# Patient Record
Sex: Male | Born: 2000 | Race: White | Hispanic: No | Marital: Single | Smoking: Never smoker
Health system: Southern US, Community
[De-identification: ages and names within clinical notes are randomized; demographics above are authoritative.]

---
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
## 2020-06-15 ENCOUNTER — Encounter (HOSPITAL_BASED_OUTPATIENT_CLINIC_OR_DEPARTMENT_OTHER): Payer: Self-pay

## 2020-06-15 ENCOUNTER — Other Ambulatory Visit: Payer: Self-pay

## 2020-06-15 ENCOUNTER — Emergency Department (HOSPITAL_BASED_OUTPATIENT_CLINIC_OR_DEPARTMENT_OTHER)
Admission: EM | Admit: 2020-06-15 | Discharge: 2020-06-15 | Disposition: A | Discharge status: Home / Self Care | Payer: Commercial Managed Care - PPO | Attending: Emergency Medicine | Admitting: Emergency Medicine

## 2020-06-15 DIAGNOSIS — F419 Anxiety disorder, unspecified: Secondary | ICD-10-CM | POA: Diagnosis not present

## 2020-06-15 DIAGNOSIS — R42 Dizziness and giddiness: Secondary | ICD-10-CM | POA: Diagnosis present

## 2020-06-15 MED ORDER — MECLIZINE HCL 25 MG PO TABS: 25.0000 mg | ORAL_TABLET | Freq: Three times a day (TID) | ORAL | 0 refills | Status: DC | PRN

## 2020-06-15 NOTE — ED Triage Notes (Signed)
Pt arrives with c/o SOB last week states he was Covid tested which was negative, then he got his Covid vaccine. Pt reports continued dizziness since went to the dr and was told he has a ear infection and sinus infection, was started on medication for that but c/o continued dizziness. Pt had PCR test on Tuesday that was negative.

## 2020-06-15 NOTE — ED Provider Notes (Signed)
[facility] EMERGENCY DEPARTMENT Provider Note   CSN: [id number] Arrival date & time: 06/15/20  1040     History Chief Complaint  Patient presents with  . Dizziness    Lance Ellis is a 19 y.o. male with PMHx tension headaches and BPPV who presents to the ED today with complaints of persistent dizziness for the past 2 weeks. Pt reports hx of vertigo - he states he takes meclizine "sometimes." He states when he does take it he takes 1 tablet at nighttime. He reports [facility] and was diagnosed with an ear infection and sinusitis; he was prescribed a Zpack however he states he continues to have dizziness intermittently. Pt states if he stares off into the distance for a prolonged period of time he will feel like he is moving even though he isnt. He denies any blurry vision or double vision. Pt states all of this started after he woke up in the middle of the night feeling like he could not take a deep breath in. He states the next day he went to get COVID tested; it was negative. He then decided to get vaccinated the same day. Pt had another COVID test at [facility] the other day which was negative. He denies feeling off balanced with ambulation. No severe headache, vision changes, nausea, vomiting, speech difficulties, confusion, unilateral weakness or numbness, or any other associated symptoms.   The history is provided by the patient and medical records.       History reviewed. No pertinent past medical history.  Patient Active Problem List   Diagnosis Date Noted  . Episodic tension-type headache, not intractable 02/02/2018  . Benign paroxysmal positional vertigo 01/20/2016  . Tics of organic origin 01/20/2016    History reviewed. No pertinent surgical history.     No family history on file.  Social History   Tobacco Use  . Smoking status: Never Smoker  . Smokeless tobacco: Never Used  Substance Use Topics  . Alcohol use: No  . Drug use: No     Home Medications Prior to Admission medications   Medication Sig Start Date End Date Taking? Authorizing Provider  busPIRone (BUSPAR) 10 MG tablet Take 20 mg by mouth daily.    [provider]  cetirizine (ZYRTEC) 10 MG chewable tablet Chew 10 mg by mouth daily.    [provider]  escitalopram (LEXAPRO) 10 MG tablet Take 10 mg by mouth every morning. 12/16/15   [provider]  meclizine (ANTIVERT) 25 MG tablet Take 1 tablet (25 mg total) by mouth 3 (three) times daily as needed for dizziness. 06/15/20   Tanda Rockers, PA-C  methylphenidate 27 MG PO CR tablet Take 27 mg by mouth every morning. 11/09/17   [provider]  ranitidine (ZANTAC) 75 MG tablet Take 150 mg by mouth daily.     [provider]  tiZANidine (ZANAFLEX) 4 MG tablet Take 1/2 to 1 tablet at bedtime as needed for spasm 10/29/17   [provider]    Allergies    Cefdinir and Other  Review of Systems   Review of Systems  Constitutional: Negative for chills and fever.  Eyes: Negative for visual disturbance.  Gastrointestinal: Negative for nausea and vomiting.  Neurological: Positive for dizziness. Negative for syncope, facial asymmetry, speech difficulty, weakness, light-headedness, numbness and headaches.  All other systems reviewed and are negative.   Physical Exam Updated Vital Signs BP 125/84 (BP Location: Left Arm)  Pulse 87   Temp 98.8 F (37.1 C) (Oral)   Resp (!) 22   Ht 5\' 10"  (1.778 m)   Wt 77.1 kg   SpO2 100%   BMI 24.39 kg/m   Physical Exam Vitals and nursing note reviewed.  Constitutional:      Appearance: He is not ill-appearing or diaphoretic.  HENT:     Head: Normocephalic and atraumatic.     Right Ear: Tympanic membrane, ear canal and external ear normal.     Left Ear: Tympanic membrane, ear canal and external ear normal.  Eyes:     Extraocular Movements: Extraocular movements intact.     Conjunctiva/sclera: Conjunctivae normal.      Pupils: Pupils are equal, round, and reactive to light.  Cardiovascular:     Rate and Rhythm: Normal rate and regular rhythm.     Pulses: Normal pulses.  Pulmonary:     Effort: Pulmonary effort is normal.     Breath sounds: Normal breath sounds. No wheezing, rhonchi or rales.  Abdominal:     Palpations: Abdomen is soft.     Tenderness: There is no abdominal tenderness. There is no guarding or rebound.  Musculoskeletal:     Cervical back: Neck supple.  Skin:    General: Skin is warm and dry.  Neurological:     Mental Status: He is alert.     Comments: CN 3-12 grossly intact A&O x4 GCS 15 Sensation and strength intact Gait nonataxic including with tandem walking Coordination with finger-to-nose WNL Neg romberg, neg pronator drift     ED Results / Procedures / Treatments   Labs (all labs ordered are listed, but only abnormal results are displayed) Labs Reviewed - No data to display  EKG None  Radiology No results found.  Procedures Procedures (including critical care time)  Medications Ordered in ED Medications - No data to display  ED Course  I have reviewed the triage vital signs and the nursing notes.  Pertinent labs & imaging results that were available during my care of the patient were reviewed by me and considered in my medical decision making (see chart for details).    MDM Rules/Calculators/A&P                          19 year old male who presents to the ED today complaining of recurrent dizziness for the past 2 weeks.  He has a history of vertigo and occasionally will take meclizine once daily.  He does not know what dose he is on.  Reports that he was recently treated for an ear infection and sinusitis with zpack.  On arrival to the ED patient is afebrile, nontachycardic nontachypneic.  He appears to be in no acute distress.  Per chart review patient has had multiple urgent care, family medicine visits since 2019 for ear infections and chronic sinusitis.  On exam today pt's TMs are clear; no sinus TTP; normal EACs bilaterally. LCTAB; pt did just get COVID tested and have a CXR at [facility] 5 days ago which was negative. He has no focal neuro deficits on exam today. I personally ambulated patient; steady gait. It does appear he saw peds neuro in the past for his vertigo and tension headaches. Will recommend he follow back up with them however feel pt may benefit from ENT referral at this time. Have instructed that pt increase his meclizine dose to 3 times per day PRN; will represcribe for him today.  As I walk  back into the room to update patient on the plan his mom is in the room with him. She states that pt has been off of his anxiety medication for the past year and she thinks that this is causing him to be hyper focused on his symptoms. I have recommended that follow back up with PCP to discuss restarting medications if pt is interested. They report that pt is scheduled to see Dr. Jenne Pane on 09/24 however if they can be seen sooner they are interested. Will place ambulatory referral at this time. Mom and pt to pick up meclizine and take TID PRN. Pt stable for discharge.   This note was prepared using Dragon voice recognition software and may include unintentional dictation errors due to the inherent limitations of voice recognition software.   Final Clinical Impression(s) / ED Diagnoses Final diagnoses:  Vertigo  Anxiety    Rx / DC Orders ED Discharge Orders         Ordered    meclizine (ANTIVERT) 25 MG tablet  3 times daily PRN        06/15/20 1715           Discharge Instructions     Please pick up Meclizine and take as prescribed (you can take it up to 3 times per day).  Call Dr. Annalee Genta E[facility] to see if they can get you in sooner than your scheduled appointment in the next few weeks.  Drink plenty of water to stay hydrated and get plenty of sleep.  Follow up with your PCP to discuss restarting anxiety medications.        Tanda Rockers, PA-C 06/15/20 1717    Tegeler, Canary Brim, MD 06/15/20 [phone]

## 2020-06-15 NOTE — Discharge Instructions (Addendum)
Please pick up Meclizine and take as prescribed (you can take it up to 3 times per day).  Call Dr. Annalee Genta ENT to see if they can get you in sooner than your scheduled appointment in the next few weeks.  Drink plenty of water to stay hydrated and get plenty of sleep.  Follow up with your PCP to discuss restarting anxiety medications.

## 2020-06-18 ENCOUNTER — Other Ambulatory Visit: Payer: Self-pay

## 2020-06-18 ENCOUNTER — Ambulatory Visit (INDEPENDENT_AMBULATORY_CARE_PROVIDER_SITE_OTHER): Payer: Commercial Managed Care - PPO | Admitting: Pediatrics

## 2020-06-18 ENCOUNTER — Encounter (INDEPENDENT_AMBULATORY_CARE_PROVIDER_SITE_OTHER): Payer: Self-pay | Admitting: Pediatrics

## 2020-06-18 VITALS — BP 120/80 | HR 72 | Ht 69.75 in | Wt 171.0 lb

## 2020-06-18 DIAGNOSIS — R42 Dizziness and giddiness: Secondary | ICD-10-CM | POA: Insufficient documentation

## 2020-06-18 DIAGNOSIS — F411 Generalized anxiety disorder: Secondary | ICD-10-CM | POA: Diagnosis not present

## 2020-06-18 DIAGNOSIS — G44219 Episodic tension-type headache, not intractable: Secondary | ICD-10-CM | POA: Diagnosis not present

## 2020-06-18 MED ORDER — BUSPIRONE HCL 10 MG PO TABS
ORAL_TABLET | ORAL | 5 refills | Status: DC
Start: 2020-06-18 — End: 2020-07-10

## 2020-06-18 NOTE — Patient Instructions (Signed)
Was a pleasure to see you today.  Your examination was entirely normal.  I know that you have a lot of symptoms, but fortunately they are not evident to me today.  I think that the headaches you have experienced are tension type in nature.  I do not think that you have benign positional paroxysmal vertigo.  I do think that there is some issue with disequilibrium, but I am not certain what is causing it.  I think it is reasonable to take 400 mg of ibuprofen when you have a headache.  I do not think you need neuroimaging at this time.  Due to your self-admitted anxiety we will restart BuSpar at a low dose.  I will be prepared to increase the dose every couple of weeks.  Simultaneously I want your mother to look and find a list of psychiatrists in our community who are in network for your insurance we will make a decision about who to request a consult from.  Please come back to see me in 3 months I will see you sooner based on your clinical need.

## 2020-06-18 NOTE — Progress Notes (Signed)
Patient: Lance Ellis MRN: [id number] Sex: male DOB: 05-May-2001  Provider: Ellison Carwin, MD Location of Care: [facility]  Note type: Routine return visit  History of Present Illness: Referral Source: Loyola Mast, MD History from: mother, patient and [facility] chart Chief Complaint: Headaches  Lance Ellis is a 19 y.o. male who was evaluated June 18, 2020 for the first time since February 02, 2018.  At that time he had an unusual constellation of daily headaches and positional dizziness.  He said that the dizziness was also present daily and felt as if he was on a rocking boat however he had a positional quality to it which suggested to me that he might have benign paroxysmal positional vertigo of left ear.  He seemed to be able to minimize his symptoms.  I do not think that he ever was seen by an ear nose and throat physician to perform an Epley maneuver that might have brought his symptoms under complete control.  2 weeks ago he had onset of awakening around 3 AM feeling dizzy and having difficulty breathing.  His chest felt tight.  He felt a thumping in the back of his head.  When he walks he feels as if he is floating.  He is not experiencing true vertigo although if he twists his head to the left with the left ear facing down, it exacerbates his dizziness.  His headaches are nondescript dull achy pain.  There are no other symptoms in association with them.  He has had a number of diagnoses of sinusitis and otitis media but is yet to see ear nose and throat physician which I think is needed.  He had complaints of stomach pain and was diagnosed with gastroesophageal reflux.  He was treated with omeprazole which did not change his stomach pain.  There are times that he would experience rather significant dizziness while just sitting on the floor on one occasion he nearly fell backwards.  He felt as if he was on a ship.  A week ago he was diagnosed with otitis media and  sinusitis and placed on erythromycin which further upset his stomach.  He has had at least 2 test for Covid and is had 1 vaccination with the Pfizer vaccine.  He was seen in the emergency department last weekend and had some high-pitched ringing in his ears but his examination was entirely normal according to the emergency physician.  The symptoms described above largely subsided other than the dull headache and the feeling of unsteady of lightheadedness.  His headaches seem to be most prominent in the vertex in the parietal region and are intermittent.  He was scheduled to see Dr. Jenne Pane or Dr. Annalee Genta, ENT surgeons to evaluate his ears and sinuses.  He graduated from high school and attends [employer] in a general education course.  He is not working now.  He readily admits to having significant problems with anxiety.  His psychiatrist retired.  We will need to find him another psychiatrist but until then I am willing to start him on low-dose BuSpar.  Review of Systems: A complete review of systems was remarkable for patient is here to be seen for dizziness. He reports that two weeks ago, he experienced dizziness and trouble breathing at 3 am. He states that he went to the er and was given medication for GERD. He states at times, when he stands still that he feels thumping in the back of his head. He also states that  he has randompain in his head and eyes. He reports that when he walks, he feels like he is floating. He has no other concerns at this time. , all other systems reviewed and negative.  Past Medical History History reviewed. No pertinent past medical history. Hospitalizations: No., Head Injury: No., Nervous System Infections: No., Immunizations up to date: Yes.    Behavior History Anxiety  Surgical History History reviewed. No pertinent surgical history.  Family History family history is not on file. Family history is negative for migraines, seizures, intellectual disabilities,  blindness, deafness, birth defects, chromosomal disorder, or autism.  Social History Socioeconomic History  . Marital status: Single  . Years of education:  53  . Highest education level:  High school graduate, first year of community college  Occupational History  . Not currently working  Tobacco Use  . Smoking status: Never Smoker  . Smokeless tobacco: Never Used  Substance and Sexual Activity  . Alcohol use: No  . Drug use: No  . 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     He attended [employer].     He enjoys playing basketball, football, swimming, and playing video games.    He lives with his mother and younger brother. He and his brother visit his father every other weekend and one night a week.     He is currently attending [employer] for general education   Allergies Allergen Reactions  . Cefdinir Rash  . Other     Seasonal Allergies     Physical Exam BP 120/80   Pulse 72   Ht 5' 9.75" (1.772 m)   Wt 171 lb (77.6 kg)   BMI 24.71 kg/m   General: alert, well developed, well nourished, in no acute distress, sandy hair, blue eyes, right handed Head: normocephalic, no dysmorphic features Ears, Nose and Throat: Otoscopic: tympanic membranes normal; pharynx: oropharynx is pink without exudates or tonsillar hypertrophy Neck: supple, full range of motion, no cranial or cervical bruits Respiratory: auscultation clear Cardiovascular: no murmurs, pulses are normal Musculoskeletal: no skeletal deformities or apparent scoliosis Skin: no rashes or neurocutaneous lesions  Neurologic Exam  Mental Status: alert; oriented to person, place and year; knowledge is normal for age; language is normal Cranial Nerves: visual fields are full to double simultaneous stimuli; extraocular movements are full and conjugate; pupils are round reactive to light; funduscopic examination shows sharp disc margins with normal vessels;  symmetric facial strength; midline tongue and uvula; air conduction is greater than bone conduction bilaterally; negative Dix-Hallpike bilaterally Motor: normal strength, tone and mass; good fine motor movements; no pronator drift Sensory: intact responses to cold, vibration, proprioception and stereognosis Coordination: good finger-to-nose, rapid repetitive alternating movements and finger apposition Gait and Station: normal gait and station: patient is able to walk on heels, toes and tandem without difficulty; balance is adequate; Romberg exam is negative; Gower response is negative Reflexes: symmetric and diminished bilaterally; no clonus; bilateral flexor plantar responses  Assessment 1.  Episodic tension-type headache, not intractable, G44.219. 2.  Disequilibrium, R42. 3.  Generalized anxiety disorder, F41.1.  Discussion I am not concerned about his headaches.  The do not appear to be migrainous and there is no reason to suggest that there is any underlying brain abnormality responsible for causing them.  His disequilibrium is not as severe as it was a couple of years ago.  He does not show signs of positional vertigo in a Dix-Hallpike maneuver.  I think that anxiety is driving a lot of his symptoms.  He needs to see a psychiatrist.  I asked his mother to provide a list of providers that are in network.  Plan For now we will start him on low-dose BuSpar.  I told mother that I do not intend to prescribe this long-term and that we have to get him a psychiatrist who can assess him and properly treat him possibly with higher doses of BuSpar and Lexapro.  He will return to see me in 3 months time to check on his progress I will see him sooner based on clinical need.  Greater than 50% of the 30-minute visit was spent in counseling and coordination of care concerning the emergence of headaches disequilibrium and longstanding problems that he has with anxiety.   Medication List   Accurate as of  June 18, 2020 11:59 PM. If you have any questions, ask your nurse or doctor.      TAKE these medications   busPIRone 10 MG tablet Commonly known as: BUSPAR Take 1 tablet daily What changed:   how much to take  how to take this  when to take this  additional instructions Changed by: Ellison Carwin, MD   cetirizine 10 MG chewable tablet Commonly known as: ZYRTEC Chew 10 mg by mouth daily.   tiZANidine 4 MG tablet Commonly known as: ZANAFLEX Take 1/2 to 1 tablet at bedtime as needed for spasm    The medication list was reviewed and reconciled. All changes or newly prescribed medications were explained.  A complete medication list was provided to the patient/caregiver.  Deetta Perla MD

## 2020-06-28 ENCOUNTER — Other Ambulatory Visit: Payer: Self-pay | Admitting: Physician Assistant

## 2020-06-28 DIAGNOSIS — R1011 Right upper quadrant pain: Secondary | ICD-10-CM

## 2020-07-02 ENCOUNTER — Other Ambulatory Visit: Payer: Self-pay | Admitting: Otolaryngology

## 2020-07-02 ENCOUNTER — Other Ambulatory Visit: Payer: Self-pay

## 2020-07-02 ENCOUNTER — Encounter (INDEPENDENT_AMBULATORY_CARE_PROVIDER_SITE_OTHER): Payer: Self-pay | Admitting: Pediatrics

## 2020-07-02 ENCOUNTER — Ambulatory Visit (INDEPENDENT_AMBULATORY_CARE_PROVIDER_SITE_OTHER): Payer: Commercial Managed Care - PPO | Admitting: Pediatrics

## 2020-07-02 VITALS — BP 112/80 | HR 60 | Ht 69.5 in | Wt 172.8 lb

## 2020-07-02 DIAGNOSIS — R42 Dizziness and giddiness: Secondary | ICD-10-CM | POA: Diagnosis not present

## 2020-07-02 DIAGNOSIS — G44229 Chronic tension-type headache, not intractable: Secondary | ICD-10-CM | POA: Diagnosis not present

## 2020-07-02 DIAGNOSIS — G44219 Episodic tension-type headache, not intractable: Secondary | ICD-10-CM | POA: Diagnosis not present

## 2020-07-02 DIAGNOSIS — F411 Generalized anxiety disorder: Secondary | ICD-10-CM

## 2020-07-02 DIAGNOSIS — J329 Chronic sinusitis, unspecified: Secondary | ICD-10-CM

## 2020-07-02 NOTE — Patient Instructions (Addendum)
I ordered the study.  I hope that it will be accepted.  Let me know when the CT of sinuses is complete.  There is an abnormal study have them make a copy and give it to me.  Keep your previously scheduled appointment.

## 2020-07-02 NOTE — Progress Notes (Signed)
Patient: Lance Ellis MRN: [id number] Sex: male DOB: 08-15-2001  Provider: Ellison Carwin, MD Location of Care: [facility]  Note type: Routine return visit  History of Present Illness: Referral Source: Loyola Mast, MD History from: mother, patient and [facility] chart Chief Complaint: Headaches  Lance Ellis is a 19 y.o. male who was seen July 02, 2020 for the first time since June 18, 2020.  He has a history of daily headaches associated with dizziness.  This dizziness is had different qualities but is more consistent with disequilibrium like rocking on a boat there was a positional quality to it at one point which is not present now.  2 weeks ago he awakened feeling dizzy having difficulty breathing his chest was tight he felt a thumping in the back of his head and felt as if he was floating while walking.  This is most consistent with a panic attack.  Headaches are nondescript dull achy pain.  He does not experience nausea vomiting sensitivity to light or sound  He has complaints of chronic stomach pain which did not respond to omeprazole despite a diagnosis of gastroesophageal reflux.  He has been recently diagnosed with otitis media and sinusitis was treated with erythromycin which upset his stomach.  He was seen by Dr. Jenne Pane who agreed that he should have a CT scan of the sinuses but thought this was more likely a neurologic problem.  He has significant problems with anxiety and has been placed on BuSpar and more recently escitalopram.  He returns today because he continues to have headaches and dizziness, pain behind his eyes tinnitus.  He is frightened of these symptoms and is requesting an MRI scan of the brain.  He has no other signs of illness.  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  Review of Systems: A complete review of systems was remarkable for patient is here to be seen for headaches. Patient  reports that he is still experiencing pain in his eyes and he back of his head. He also reports that he sometimes has ringing in his ears. He states that he is worried about the headaches and dizziness as well. Mom reports that she would like to have a MRI done. No other concerns reported at this time., all other systems reviewed and negative.  Past Medical History History reviewed. No pertinent past medical history. Hospitalizations: No., Head Injury: No., Nervous System Infections: No., Immunizations up to date: Yes.     Birth History 9 lbs. 7 oz. infant born at 49.[redacted] weeks gestational age to a 19 year old g 1 p 0 male. Gestation was complicated by uterine fibroid Mother received Epidural anesthesia  Primary cesarean section Nursery Course was uncomplicated Growth and Development was recalled as  normal  Behavior History Anxiety  Surgical History History reviewed. No pertinent surgical history.  Family History family history is not on file. Family history is negative for migraines, seizures, intellectual disabilities, blindness, deafness, birth defects, chromosomal disorder, or autism.  Social History Socioeconomic History  . Marital status: Single  . Years of education:  66  . Highest education level:  High school graduate  Occupational History  . Not employed  Tobacco Use  . Smoking status: Never Smoker  . Smokeless tobacco: Never Used  Substance and Sexual Activity  . Alcohol use: No  . Drug use: No  . 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     He attended [employer].     He enjoys playing basketball, football, swimming, and playing video games.    He lives with his mother and younger brother. He and his brother visit his father every other weekend and one night a week.     He is currently attending [employer] for general education   Allergies Allergen Reactions  . Cefdinir Rash  . Other      Seasonal Allergies     Physical Exam BP 112/80   Pulse 60   Ht 5' 9.5" (1.765 m)   Wt 172 lb 12.8 oz (78.4 kg)   BMI 25.15 kg/m   General: alert, well developed, well nourished, in no acute distress, sandy hair, blue eyes, right handed Head: normocephalic, no dysmorphic features Ears, Nose and Throat: Otoscopic: tympanic membranes normal; pharynx: oropharynx is pink without exudates or tonsillar hypertrophy Neck: supple, full range of motion, no cranial or cervical bruits Respiratory: auscultation clear Cardiovascular: no murmurs, pulses are normal Musculoskeletal: no skeletal deformities or apparent scoliosis Skin: no rashes or neurocutaneous lesions  Neurologic Exam  Mental Status: alert; oriented to person, place and year; knowledge is normal for age; language is normal Cranial Nerves: visual fields are full to double simultaneous stimuli; extraocular movements are full and conjugate; pupils are round reactive to light; funduscopic examination shows sharp disc margins with normal vessels; symmetric facial strength; midline tongue and uvula; air conduction is greater than bone conduction bilaterally Motor: Normal strength, tone and mass; good fine motor movements; no pronator drift Sensory: intact responses to cold, vibration, proprioception and stereognosis Coordination: good finger-to-nose, rapid repetitive alternating movements and finger apposition Gait and Station: normal gait and station: patient is able to walk on heels, toes and tandem without difficulty; balance is adequate; Romberg exam is negative; Gower response is negative Reflexes: symmetric and diminished bilaterally; no clonus; bilateral flexor plantar responses  Assessment 1.  Episodic tension-type headache, not intractable, G44.219. 2.  Disequilibrium, R42. 3.  Generalized anxiety disorder, F41.1.  Discussion I see no significant change in his examination.  I explained to mother the process by which MRI scans  are approved.  I told her that at her request I would submit the request for an MRI scan of the brain without and with contrast.  I told her that I would let her know if it was approved or denied.  Plan I recommend that he continue taking medications as prescribed.  If we can successfully deal with his anxiety, I think his headaches and other symptoms may improve.  He will return to see me in December 2021 as previously planned.  I asked his mother to make certain that I got a copy of the sinus films particularly if they were positive.   Medication List   Accurate as of July 02, 2020  3:46 PM. If you have any questions, ask your nurse or doctor.      TAKE these medications   busPIRone 10 MG tablet Commonly known as: BUSPAR Take 1 tablet daily   cetirizine 10 MG chewable tablet Commonly known as: ZYRTEC Chew 10 mg by mouth daily.   escitalopram 10 MG tablet Commonly known as: LEXAPRO Take by mouth.   fluticasone 50 MCG/ACT nasal spray Commonly known as: FLONASE 1 spray by Each Nare route daily for 30 days.   meclizine 25 MG tablet Commonly known as: ANTIVERT Take 25 mg by mouth 3 (three) times daily as needed.  methylphenidate 18 MG CR tablet Commonly known as: CONCERTA Take by mouth.   montelukast 10 MG tablet Commonly known as: SINGULAIR Take by mouth.    The medication list was reviewed and reconciled. All changes or newly prescribed medications were explained.  A complete medication list was provided to the patient/caregiver.  Deetta Perla MD

## 2020-07-05 ENCOUNTER — Other Ambulatory Visit: Payer: Commercial Managed Care - PPO

## 2020-07-10 ENCOUNTER — Other Ambulatory Visit (INDEPENDENT_AMBULATORY_CARE_PROVIDER_SITE_OTHER): Payer: Self-pay | Admitting: Pediatrics

## 2020-07-11 ENCOUNTER — Other Ambulatory Visit: Payer: Commercial Managed Care - PPO

## 2020-07-18 ENCOUNTER — Ambulatory Visit
Admission: RE | Admit: 2020-07-18 | Discharge: 2020-07-18 | Disposition: A | Discharge status: Home / Self Care | Payer: Commercial Managed Care - PPO | Source: Ambulatory Visit | Attending: Physician Assistant | Admitting: Physician Assistant

## 2020-07-18 DIAGNOSIS — R1011 Right upper quadrant pain: Secondary | ICD-10-CM

## 2020-07-23 ENCOUNTER — Other Ambulatory Visit: Payer: Self-pay

## 2020-07-23 ENCOUNTER — Ambulatory Visit
Admission: RE | Admit: 2020-07-23 | Discharge: 2020-07-23 | Disposition: A | Discharge status: Home / Self Care | Payer: Commercial Managed Care - PPO | Source: Ambulatory Visit | Attending: Pediatrics | Admitting: Pediatrics

## 2020-07-23 DIAGNOSIS — G44219 Episodic tension-type headache, not intractable: Secondary | ICD-10-CM

## 2020-07-23 DIAGNOSIS — G44229 Chronic tension-type headache, not intractable: Secondary | ICD-10-CM

## 2020-07-23 DIAGNOSIS — R42 Dizziness and giddiness: Secondary | ICD-10-CM

## 2020-07-23 MED ORDER — GADOBENATE DIMEGLUMINE 529 MG/ML IV SOLN
15.0000 mL | Freq: Once | INTRAVENOUS | Status: AC | PRN
  Administered 2020-07-23: 15 mL via INTRAVENOUS

## 2020-07-24 ENCOUNTER — Telehealth (INDEPENDENT_AMBULATORY_CARE_PROVIDER_SITE_OTHER): Payer: Self-pay | Admitting: Pediatrics

## 2020-07-24 NOTE — Telephone Encounter (Signed)
I called mom to let her know that the study was normal.  I asked her to pass that on to [facility].  I also suggested that he sign up for My Chart.  According to her, his dizziness has been somewhat better since he started the Lexapro.  It may very well be we have a functional issue here.  I asked her to pass this information on to Dr. Jenne Pane.  I believe that there is a maxillofacial study that will take place but I expect to be normal based on what we have seen thus far.

## 2020-07-29 ENCOUNTER — Other Ambulatory Visit: Payer: Commercial Managed Care - PPO

## 2020-09-26 ENCOUNTER — Ambulatory Visit (INDEPENDENT_AMBULATORY_CARE_PROVIDER_SITE_OTHER): Payer: Commercial Managed Care - PPO | Admitting: Pediatrics

## 2020-10-29 ENCOUNTER — Ambulatory Visit (INDEPENDENT_AMBULATORY_CARE_PROVIDER_SITE_OTHER): Payer: Commercial Managed Care - PPO | Admitting: Pediatrics

## 2020-10-30 ENCOUNTER — Other Ambulatory Visit (INDEPENDENT_AMBULATORY_CARE_PROVIDER_SITE_OTHER): Payer: Self-pay | Admitting: Pediatrics

## 2020-12-05 ENCOUNTER — Other Ambulatory Visit (INDEPENDENT_AMBULATORY_CARE_PROVIDER_SITE_OTHER): Payer: Self-pay | Admitting: Family

## 2020-12-29 ENCOUNTER — Other Ambulatory Visit (INDEPENDENT_AMBULATORY_CARE_PROVIDER_SITE_OTHER): Payer: Self-pay | Admitting: Family

## 2021-01-17 ENCOUNTER — Other Ambulatory Visit (INDEPENDENT_AMBULATORY_CARE_PROVIDER_SITE_OTHER): Payer: Self-pay | Admitting: Family

## 2021-01-17 DIAGNOSIS — F411 Generalized anxiety disorder: Secondary | ICD-10-CM

## 2021-01-20 NOTE — Telephone Encounter (Signed)
Request for 90 days.

## 2021-02-13 ENCOUNTER — Encounter (INDEPENDENT_AMBULATORY_CARE_PROVIDER_SITE_OTHER): Payer: Self-pay

## 2021-08-24 IMAGING — US US ABDOMEN LIMITED
1 series · 14 of 25 positions shown · non-contrast
Comparison: None.

CLINICAL DATA: Right upper quadrant pain for 6 weeks.

EXAM:
ULTRASOUND ABDOMEN LIMITED RIGHT UPPER QUADRANT

[Series 1: us abdomen limited · 0.26mm/px · 14 of 39 slices shown]
[im 1/39]
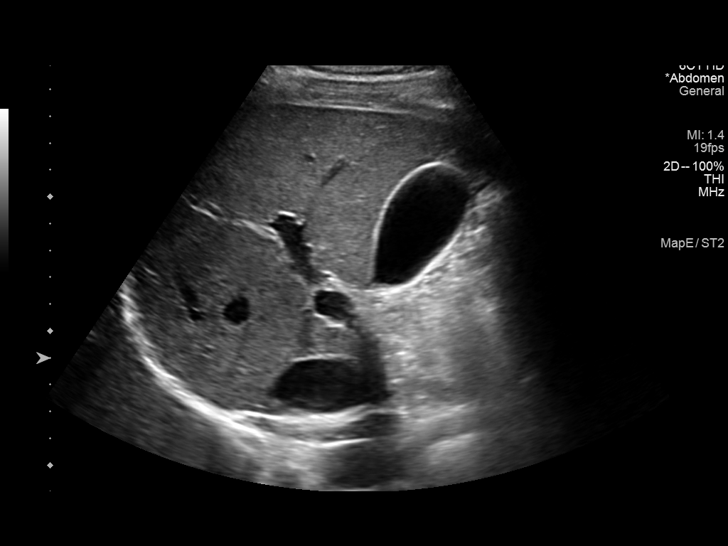
[im 4/39]
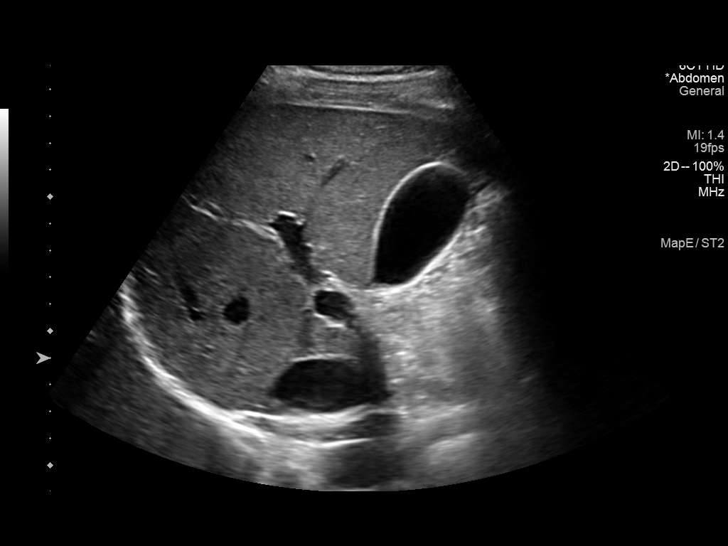
[im 7/39]
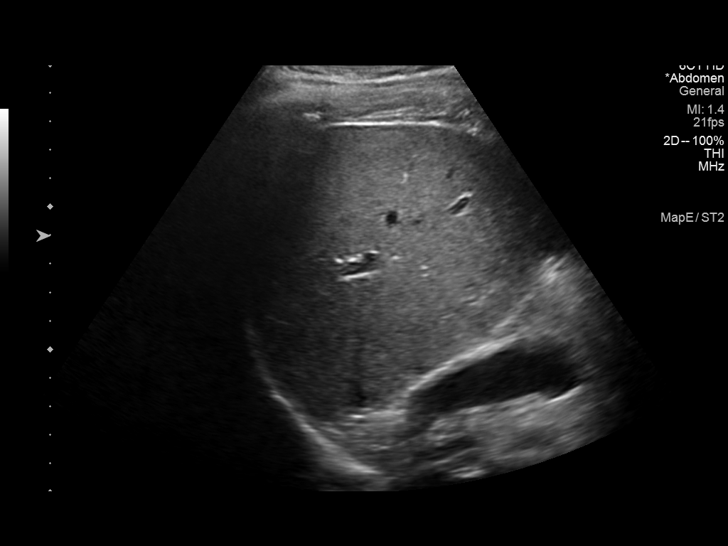
[im 10/39]
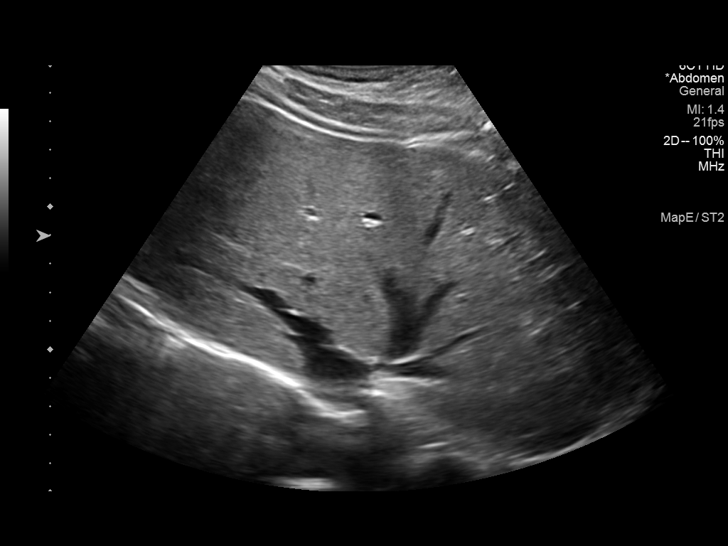
[im 13/39]
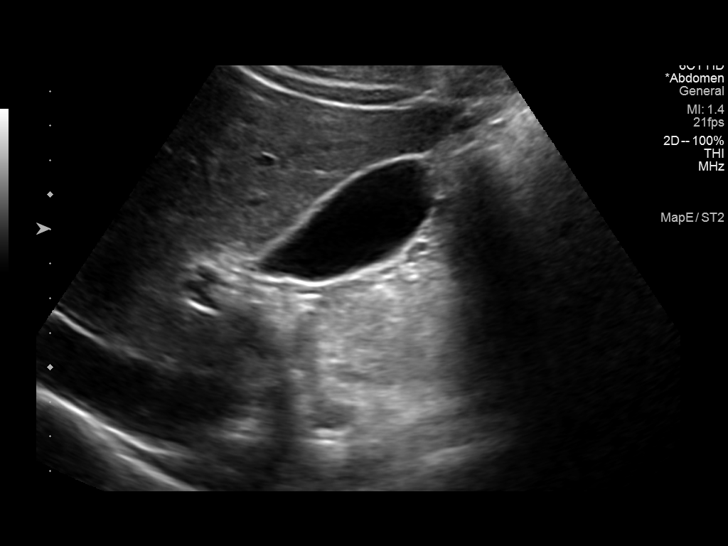
[im 15/39]
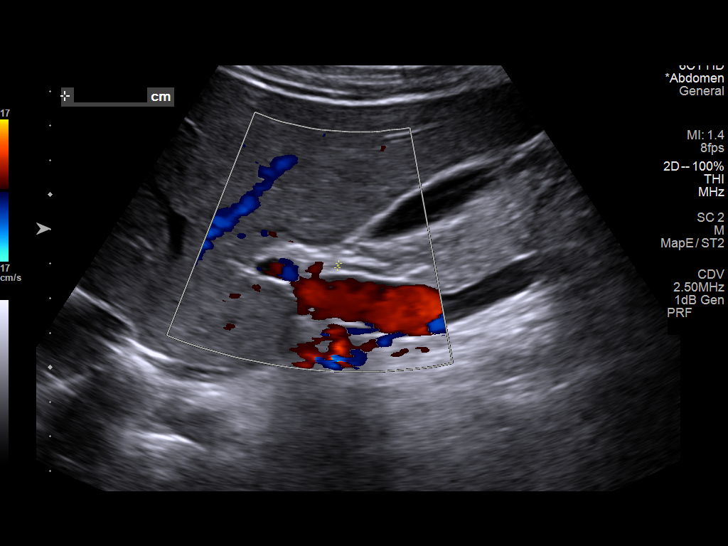
[im 18/39]
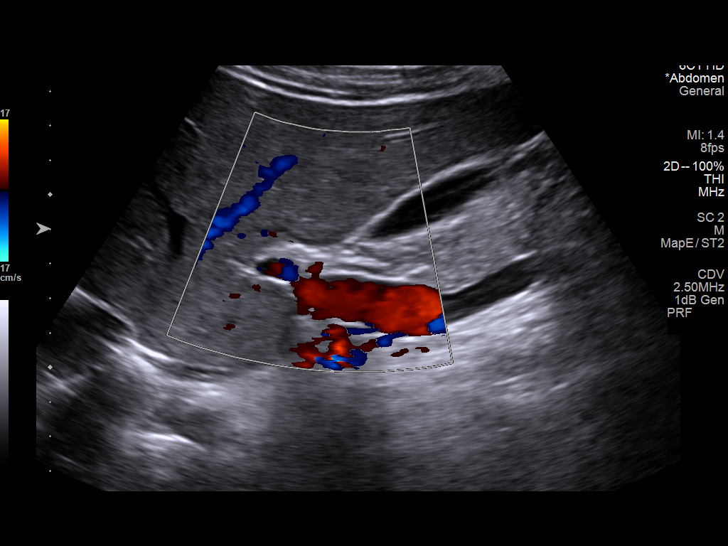
[im 21/39]
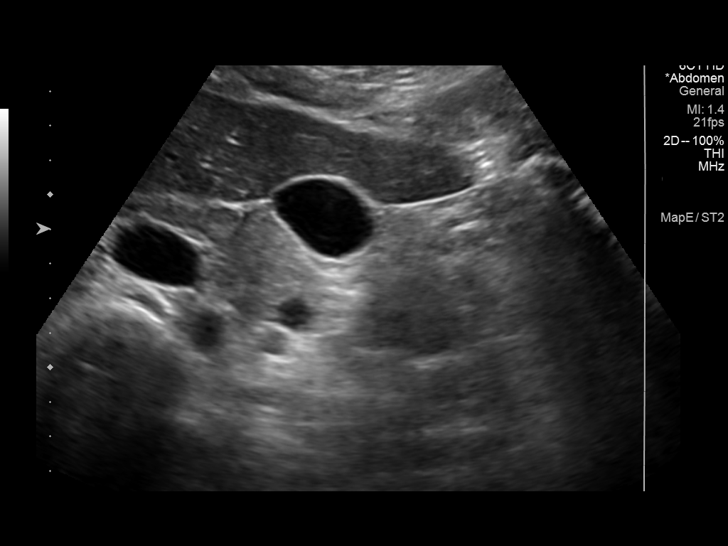
[im 24/39]
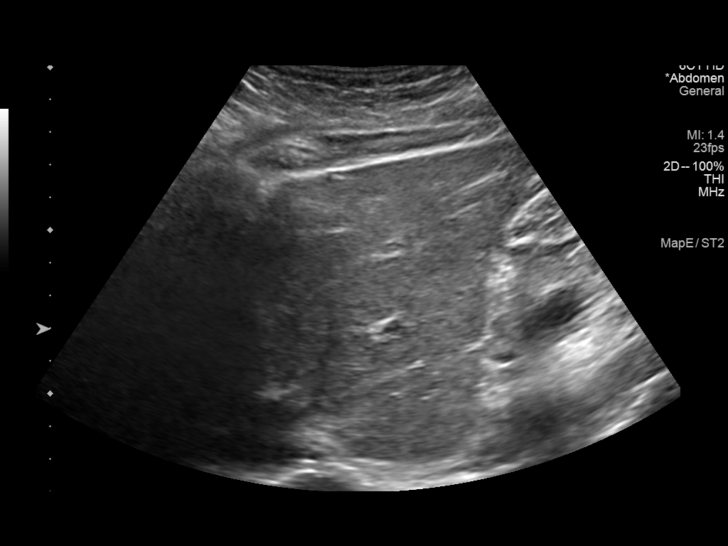
[im 26/39]
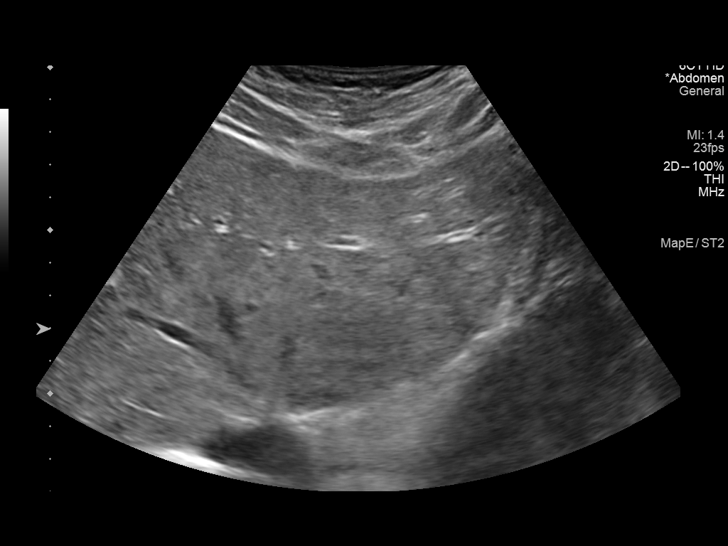
[im 29/39]
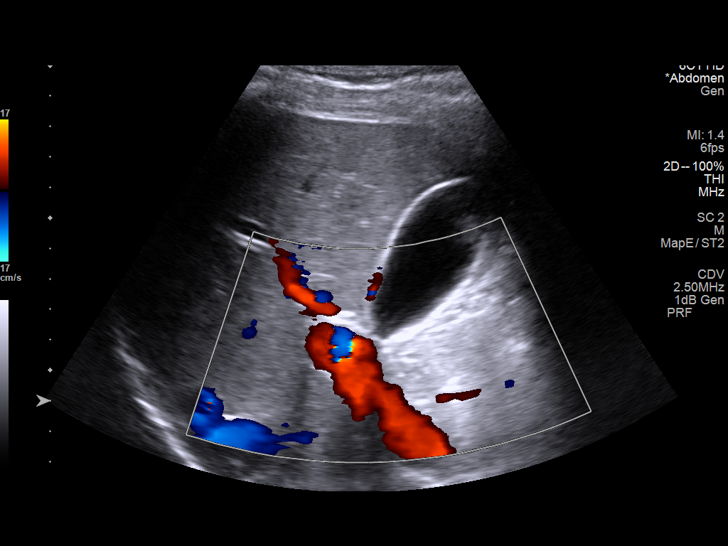
[im 32/39]
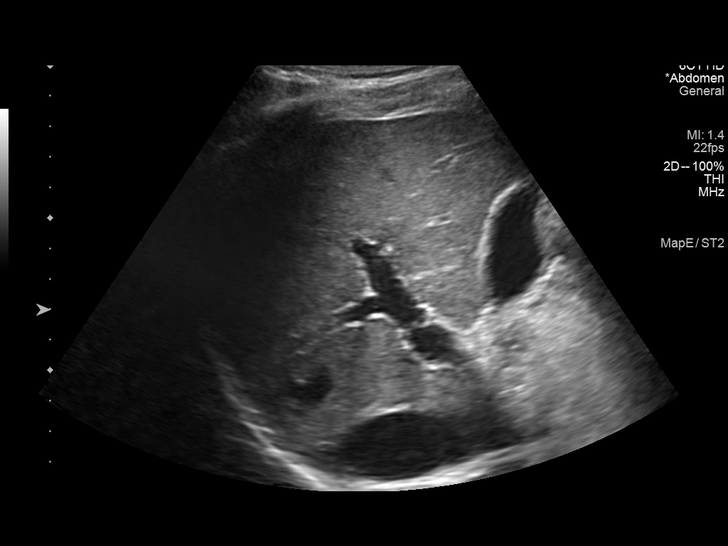
[im 35/39]
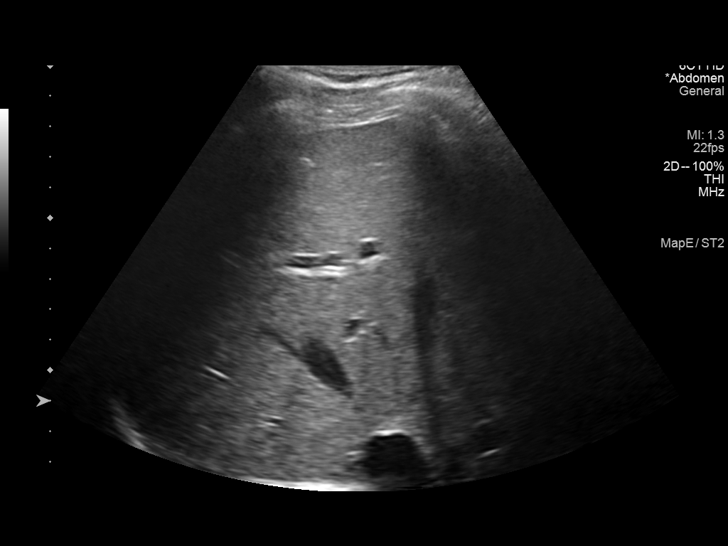
[im 39/39]
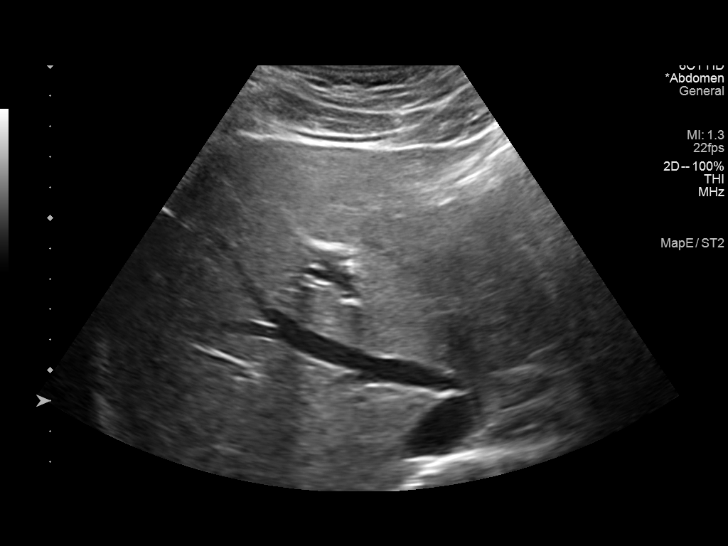

[14 of 25 positions shown; findings below may reference images not displayed]

FINDINGS: Gallbladder:

No gallstones or wall thickening visualized. No sonographic Murphy
sign noted by sonographer.

Common bile duct:

Diameter: 0.1 cm, within normal limits.

Liver:

No focal lesion identified. Within normal limits in parenchymal
echogenicity. Portal vein is patent on color Doppler imaging with
normal direction of blood flow towards the liver.

Other: None.
IMPRESSION: No sonographic finding to explain the patient's abdominal pain.

## 2022-03-20 ENCOUNTER — Emergency Department (HOSPITAL_BASED_OUTPATIENT_CLINIC_OR_DEPARTMENT_OTHER): Payer: BLUE CROSS/BLUE SHIELD

## 2022-03-20 ENCOUNTER — Emergency Department (HOSPITAL_BASED_OUTPATIENT_CLINIC_OR_DEPARTMENT_OTHER)
Admission: EM | Admit: 2022-03-20 | Discharge: 2022-03-20 | Disposition: A | Discharge status: Home / Self Care | Payer: BLUE CROSS/BLUE SHIELD | Attending: Emergency Medicine | Admitting: Emergency Medicine

## 2022-03-20 ENCOUNTER — Other Ambulatory Visit: Payer: Self-pay

## 2022-03-20 ENCOUNTER — Encounter (HOSPITAL_BASED_OUTPATIENT_CLINIC_OR_DEPARTMENT_OTHER): Payer: Self-pay | Admitting: *Deleted

## 2022-03-20 DIAGNOSIS — K529 Noninfective gastroenteritis and colitis, unspecified: Secondary | ICD-10-CM | POA: Diagnosis not present

## 2022-03-20 DIAGNOSIS — R109 Unspecified abdominal pain: Secondary | ICD-10-CM | POA: Diagnosis present

## 2022-03-20 LAB — URINALYSIS, ROUTINE W REFLEX MICROSCOPIC
Bilirubin Urine: NEGATIVE
Glucose, UA: NEGATIVE mg/dL
Hgb urine dipstick: NEGATIVE
Ketones, ur: NEGATIVE mg/dL
Leukocytes,Ua: NEGATIVE
Nitrite: NEGATIVE
Protein, ur: NEGATIVE mg/dL
Specific Gravity, Urine: 1.016 (ref 1.005–1.030)
pH: 6 (ref 5.0–8.0)

## 2022-03-20 LAB — COMPREHENSIVE METABOLIC PANEL
ALT: 45 U/L — ABNORMAL HIGH (ref 0–44)
AST: 29 U/L (ref 15–41)
Albumin: 4.5 g/dL (ref 3.5–5.0)
Alkaline Phosphatase: 64 U/L (ref 38–126)
Anion gap: 11 (ref 5–15)
BUN: 12 mg/dL (ref 6–20)
CO2: 29 mmol/L (ref 22–32)
Calcium: 9.7 mg/dL (ref 8.9–10.3)
Chloride: 101 mmol/L (ref 98–111)
Creatinine, Ser: 1.03 mg/dL (ref 0.61–1.24)
GFR, Estimated: >60 mL/min (ref >60)
Glucose, Bld: 80 mg/dL (ref 70–99)
Potassium: 3.5 mmol/L (ref 3.5–5.1)
Sodium: 141 mmol/L (ref 135–145)
Total Bilirubin: 0.5 mg/dL (ref 0.3–1.2)
Total Protein: 7.7 g/dL (ref 6.5–8.1)

## 2022-03-20 LAB — CBC
HCT: 45.8 % (ref 39.0–52.0)
Hemoglobin: 15.9 g/dL (ref 13.0–17.0)
MCH: 31.6 pg (ref 26.0–34.0)
MCHC: 34.7 g/dL (ref 30.0–36.0)
MCV: 91.1 fL (ref 80.0–100.0)
Platelets: 233 10*3/uL (ref 150–400)
RBC: 5.03 MIL/uL (ref 4.22–5.81)
RDW: 11.4 % — ABNORMAL LOW (ref 11.5–15.5)
WBC: 6.4 10*3/uL (ref 4.0–10.5)
nRBC: 0 % (ref 0.0–0.2)

## 2022-03-20 LAB — LIPASE, BLOOD: Lipase: 18 U/L (ref 11–51)

## 2022-03-20 MED ORDER — ONDANSETRON HCL 4 MG PO TABS: 4.0000 mg | ORAL_TABLET | Freq: Four times a day (QID) | ORAL | 0 refills | Status: AC

## 2022-03-20 NOTE — Discharge Instructions (Addendum)
Your symptoms are most consistent with a viral gastroenteritis, otherwise known as a stomach bug.  You should start feeling better in a few days, however I sent you in some Zofran which is a medication you can take whenever you feel nauseous.  Your ultrasound was normal and so are your labs.  Feel better soon!

## 2022-03-20 NOTE — ED Triage Notes (Signed)
Pt woke up with nausea and vomiting and pain in stomach and back Sunday.  Pt has been having intermittent sharp cramping pain in stomach with radiation into both testicles since.  No further vomiting since.

## 2022-03-20 NOTE — ED Notes (Signed)
Pt currently denis N/V.

## 2022-03-20 NOTE — ED Provider Notes (Signed)
[facility] EMERGENCY DEPT Provider Note   CSN: [id number] Arrival date & time: 03/20/22  1539     History  Chief Complaint  Patient presents with   Abdominal Pain    Lance Ellis is a 21 y.o. male who presents to the ED for evaluation of cramping abdominal pain and nausea that has been ongoing for 5 days.  Patient states his symptoms initially started with vomiting, diarrhea and pain all over his stomach and radiating into his back and shooting down into his testicles.  Since then, symptoms have largely improved although he continues to feel nauseous and experiences occasional testicular pain.  He notes pain feels "right in the middle" or alternate sides.  He has not vomited in 2 days and has not had diarrhea in 3 days.  Currently, he is not experiencing any abdominal pain or nausea.  He denies fevers, chills, cough, congestion, dysuria and hematuria.  He has taken Advil without improvement in his discomfort although has taken Tylenol occasionally with some improvement.   Abdominal Pain      Home Medications Prior to Admission medications   Medication Sig Start Date End Date Taking? Authorizing Provider  ondansetron (ZOFRAN) 4 MG tablet Take 1 tablet (4 mg total) by mouth every 6 (six) hours. 03/20/22  Yes Raynald Blend R, PA-C  busPIRone (BUSPAR) 10 MG tablet TAKE 1 TABLET BY MOUTH EVERY DAY 01/20/21   Elveria Rising, NP  cetirizine (ZYRTEC) 10 MG chewable tablet Chew 10 mg by mouth daily.    [provider]  escitalopram (LEXAPRO) 10 MG tablet Take by mouth. 06/25/20   [provider]  meclizine (ANTIVERT) 25 MG tablet Take 25 mg by mouth 3 (three) times daily as needed. 06/19/20   [provider]  methylphenidate 18 MG PO CR tablet Take by mouth. 06/25/20   [provider]  montelukast (SINGULAIR) 10 MG tablet Take by mouth. 06/24/20 07/24/20  [provider]      Allergies    Cefdinir and Other    Review of Systems    Review of Systems  Gastrointestinal:  Positive for abdominal pain.    Physical Exam Updated Vital Signs BP 104/78 (BP Location: Right Arm)   Pulse 77   Temp 98.2 F (36.8 C) (Oral)   Resp 18   SpO2 100%  Physical Exam Vitals and nursing note reviewed.  Constitutional:      General: He is not in acute distress.    Appearance: He is not ill-appearing.  HENT:     Head: Atraumatic.  Eyes:     Conjunctiva/sclera: Conjunctivae normal.  Cardiovascular:     Rate and Rhythm: Normal rate and regular rhythm.     Pulses: Normal pulses.     Heart sounds: No murmur heard. Pulmonary:     Effort: Pulmonary effort is normal. No respiratory distress.     Breath sounds: Normal breath sounds.  Abdominal:     General: Abdomen is flat. There is no distension.     Palpations: Abdomen is soft.     Tenderness: There is no abdominal tenderness.     Comments: Abdomen is soft, nondistended and nontender to palpation.  Genitourinary:    Testes: Normal.  Musculoskeletal:        General: Normal range of motion.     Cervical back: Normal range of motion.  Skin:    General: Skin is warm and dry.     Capillary Refill: Capillary refill takes less than 2 seconds.  Neurological:  General: No focal deficit present.     Mental Status: He is alert.  Psychiatric:        Mood and Affect: Mood normal.     ED Results / Procedures / Treatments   Labs (all labs ordered are listed, but only abnormal results are displayed) Labs Reviewed  COMPREHENSIVE METABOLIC PANEL - Abnormal; Notable for the following components:      Result Value   ALT 45 (*)    All other components within normal limits  CBC - Abnormal; Notable for the following components:   RDW 11.4 (*)    All other components within normal limits  LIPASE, BLOOD  URINALYSIS, ROUTINE W REFLEX MICROSCOPIC    EKG None  Radiology US SCROTUM W/DOPPLER  Result Date: 03/20/2022 CLINICAL DATA:  Right testicular discomfort. EXAM: SCROTAL  ULTRASOUND DOPPLER ULTRASOUND OF THE TESTICLES TECHNIQUE: Complete ultrasound examination of the testicles, epididymis, and other scrotal structures was performed. Color and spectral Doppler ultrasound were also utilized to evaluate blood flow to the testicles. COMPARISON:  None Available. FINDINGS: Right testicle Measurements: 5.0 x 2.0 x 3.1 cm. No mass or microlithiasis visualized. Left testicle Measurements: 4.6 x 2.1 x 3.2 cm. No mass or microlithiasis visualized. Right epididymis:  Normal in size and appearance. Left epididymis:  Normal in size and appearance. Hydrocele:  None visualized. Varicocele:  None visualized. Pulsed Doppler interrogation of both testes demonstrates normal low resistance arterial and venous waveforms bilaterally. IMPRESSION: Normal exam. Electronically Signed   By: Darliss Cheney M.D.   On: 03/20/2022 20:56    Procedures Procedures    Medications Ordered in ED Medications - No data to display  ED Course/ Medical Decision Making/ A&P                           Medical Decision Making Amount and/or Complexity of Data Reviewed Labs: ordered. Radiology: ordered.  Risk Prescription drug management.   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    He attended [employer].    He enjoys playing basketball, football, swimming, and playing video games.   He lives with his mother and younger brother. He and his brother visit his  father every other weekend and one night a week.    He is currently attending [employer] for general education   Social Determinants of Health   Financial Resource Strain: Not on file  Food Insecurity: Not on file  Transportation Needs: Not on file  Physical Activity: Not on file  Stress: Not on file  Social Connections: Not on file  Intimate Partner Violence: Not on file     Initial impression:  This patient presents to the ED for concern of abdominal pain, nausea and vomiting, this involves an extensive number of treatment options, and is a complaint that carries with  it a high risk of complications and morbidity.   Differentials include gastroenteritis, appendicitis, pancreatitis, testicular torsion,  Comorbidities affecting care:  None  Additional history obtained: Mother  Lab Tests  I Ordered, reviewed, and interpreted labs and EKG.  The pertinent results include:  CMP, UA, lipase and CBC normal  Imaging Studies ordered:  I ordered imaging studies including  Testicular ultrasound I independently visualized and interpreted imaging and I agree with the radiologist interpretation.   ED Course/Re-evaluation: 21 year old male presents to the ED for evaluation of abdominal pain, nausea, vomiting and testicular pain that has been ongoing for 5 days.  Vitals without significant abnormality.  Patient is overall well with appearing in no acute distress.  Abdomen is soft, nondistended and nontender to palpation.  Negative Murphy sign, negative McBurney.  Penis and testes normal.  Labs without acute findings.  I discussed with patient this is likely viral gastroenteritis, however given the testicular pain, patient and mother would feel more comfortable with testicular ultrasound performed, and I think this is reasonable. Testicular ultrasound was normal.  Plan to discharge home with Zofran for likely gastroenteritis.  Patient expresses understanding is amenable to  plan.  Disposition:  After consideration of the diagnostic results, physical exam, history and the patients response to treatment feel that the patent would benefit from discharge.   Gastroenteritis: Plan and management as described above. Discharged home in good condition.  Final Clinical Impression(s) / ED Diagnoses Final diagnoses:  Gastroenteritis    Rx / DC Orders ED Discharge Orders          Ordered    ondansetron (ZOFRAN) 4 MG tablet  Every 6 hours        03/20/22 2114              Delight OvensConklin, Divinity Kyler R, PA-C 03/20/22 2207    Rolan BuccoBelfi, Melanie, MD 03/20/22 2337

## 2023-04-26 IMAGING — US US SCROTUM W/ DOPPLER COMPLETE
1 series · 14 of 25 positions shown · non-contrast
Comparison: None Available.

CLINICAL DATA: Right testicular discomfort.

EXAM:
SCROTAL ULTRASOUND
DOPPLER ULTRASOUND OF THE TESTICLES
TECHNIQUE: Complete ultrasound examination of the testicles, epididymis, and
other scrotal structures was performed. Color and spectral Doppler
ultrasound were also utilized to evaluate blood flow to the
testicles.

[Series 1: us scrotum w/doppler · 14 of 43 slices shown]
[im 1/43]
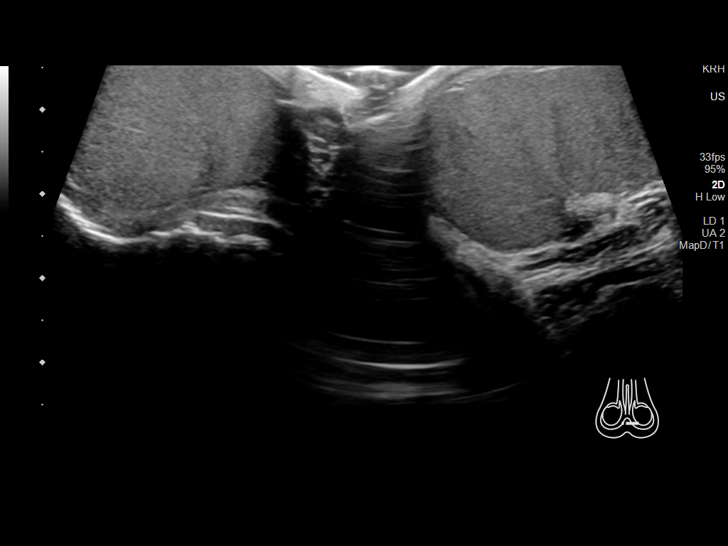
[im 4/43]
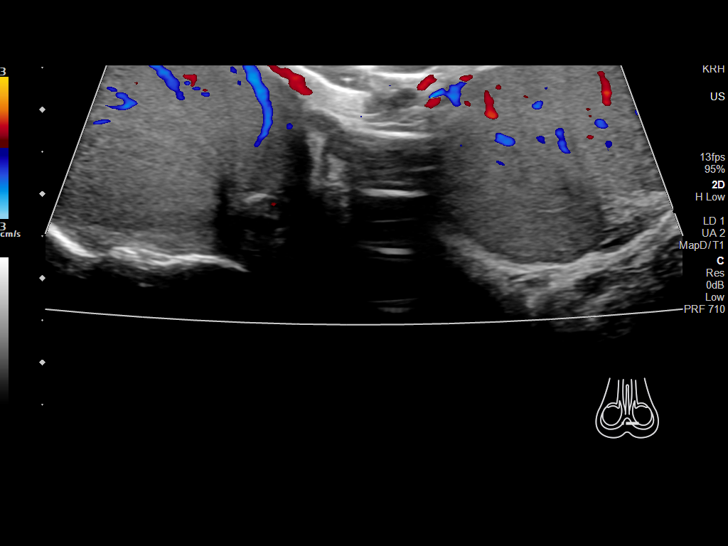
[im 8/43]
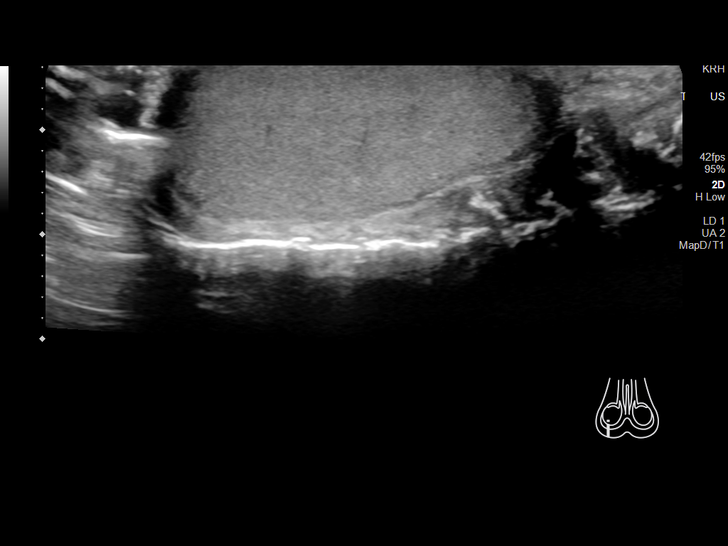
[im 11/43]
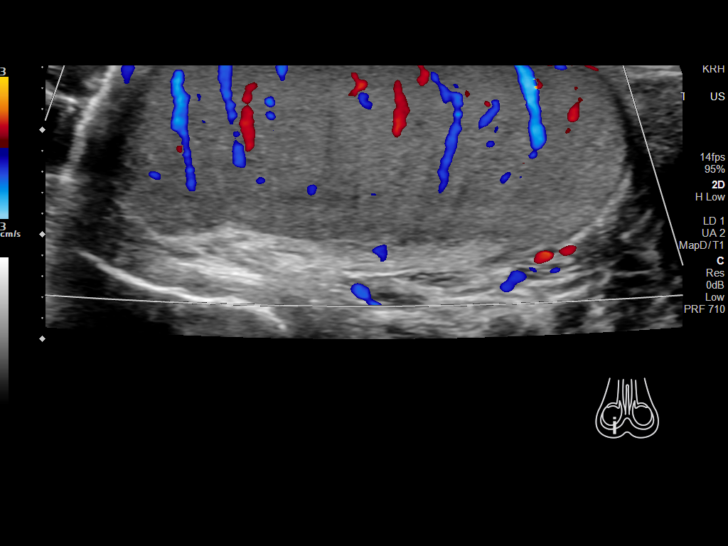
[im 15/43]
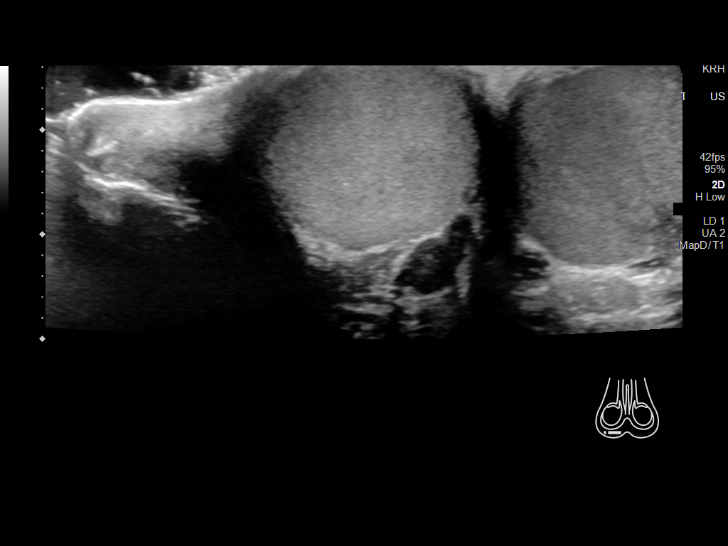
[im 16/43]
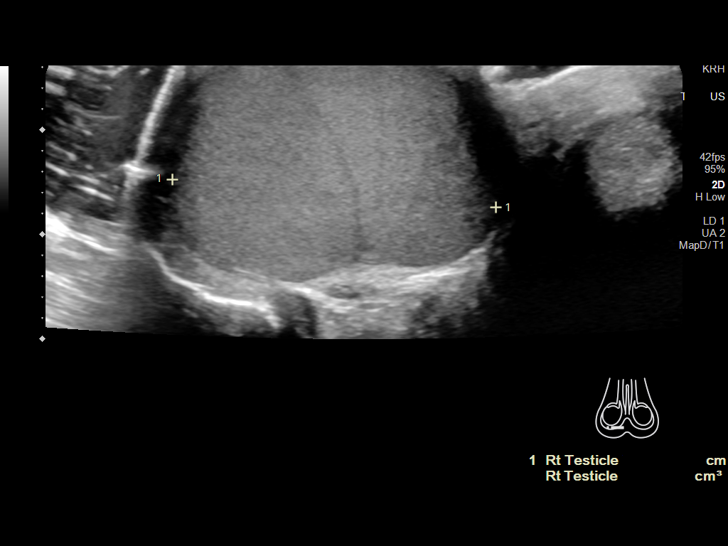
[im 20/43]
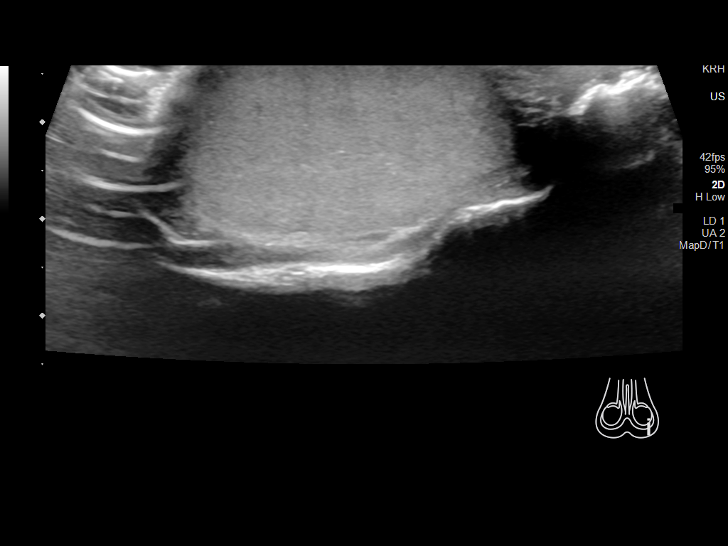
[im 23/43]
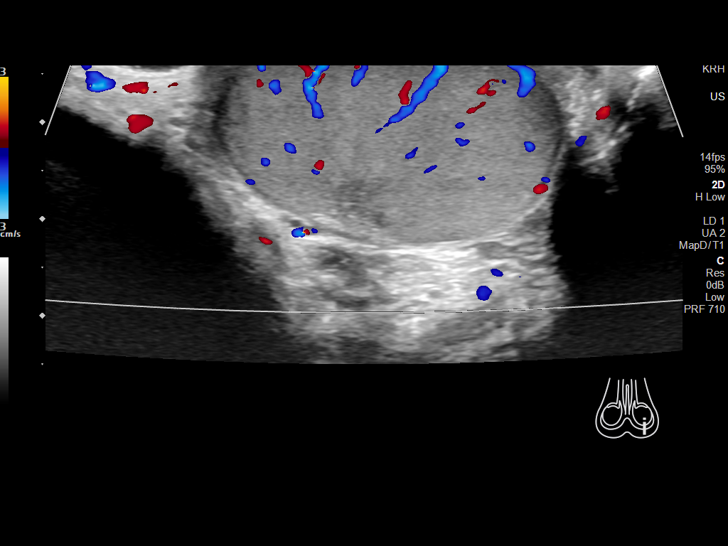
[im 27/43]
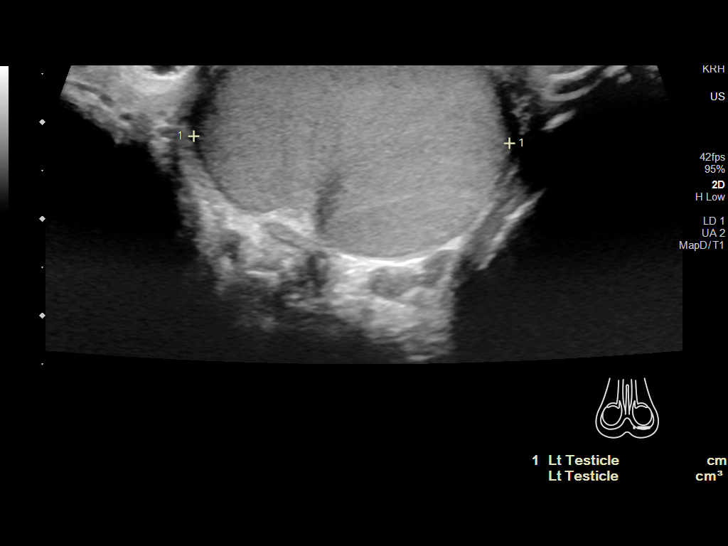
[im 29/43]
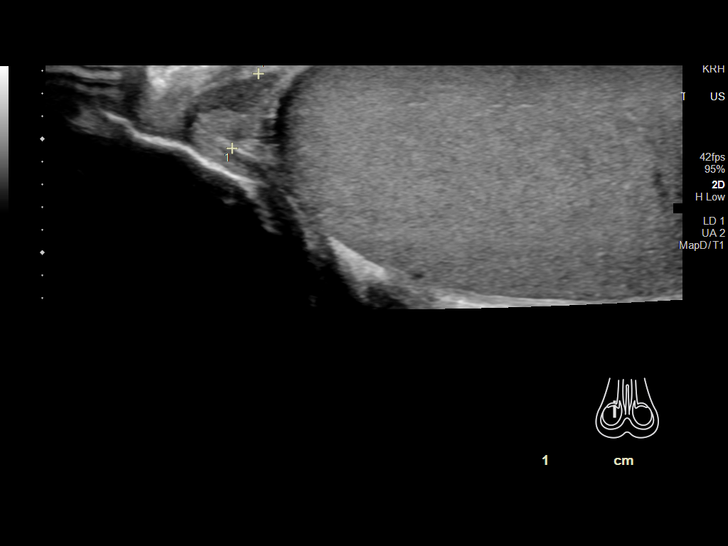
[im 32/43]
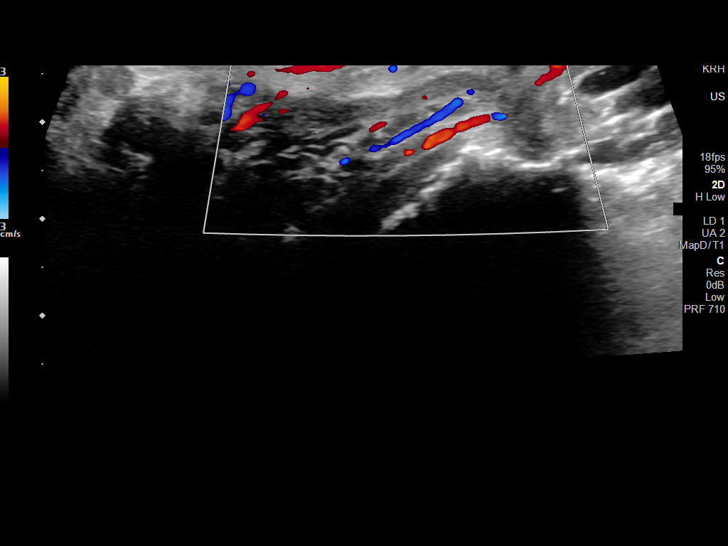
[im 36/43]
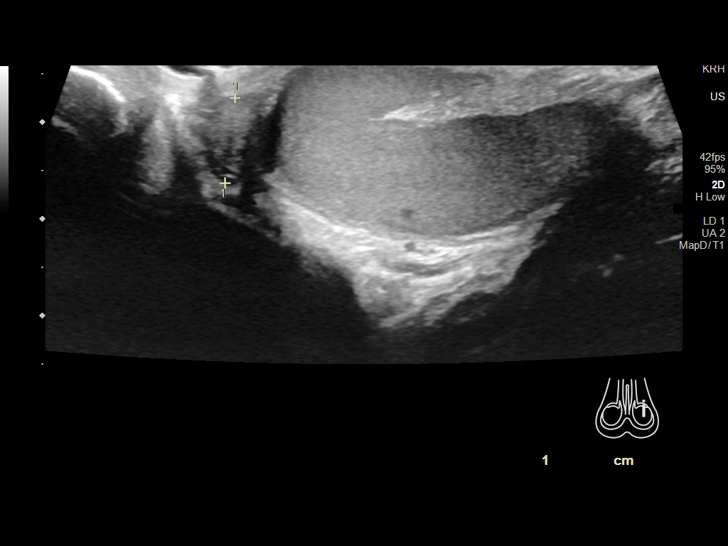
[im 39/43]
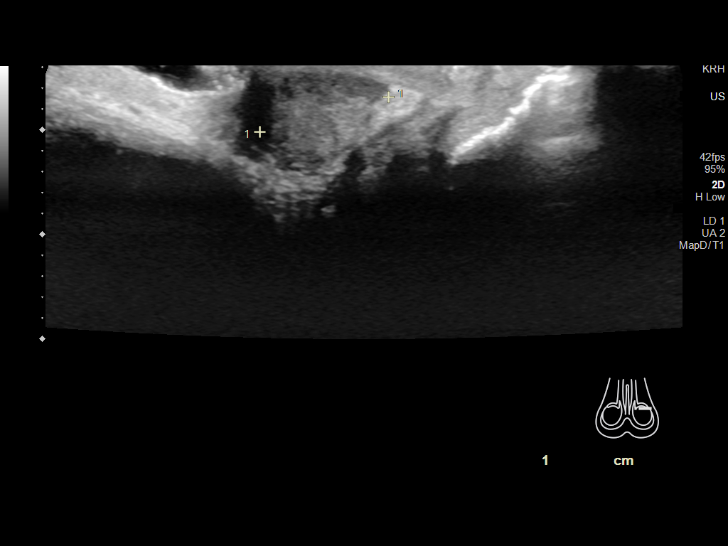
[im 43/43]
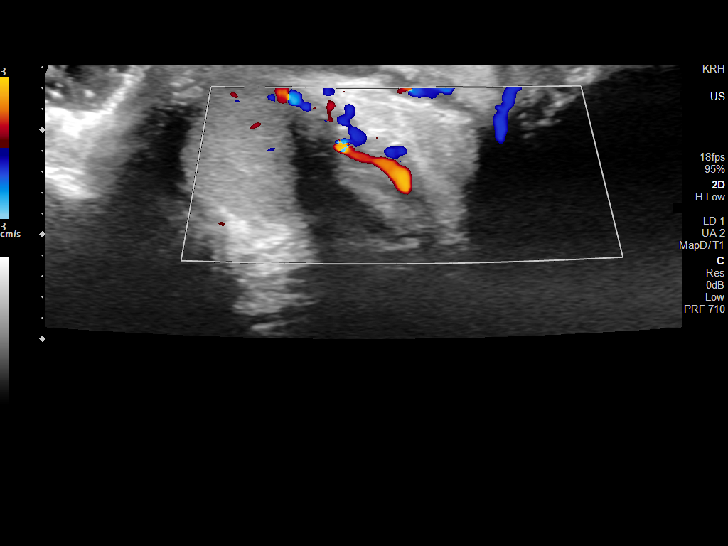

[14 of 25 positions shown; findings below may reference images not displayed]

FINDINGS: Right testicle

Measurements: 5.0 x 2.0 x 3.1 cm. No mass or microlithiasis
visualized.

Left testicle

Measurements: 4.6 x 2.1 x 3.2 cm. No mass or microlithiasis
visualized.

Right epididymis:  Normal in size and appearance.

Left epididymis:  Normal in size and appearance.

Hydrocele:  None visualized.

Varicocele:  None visualized.

Pulsed Doppler interrogation of both testes demonstrates normal low
resistance arterial and venous waveforms bilaterally.
IMPRESSION: Normal exam.
